# Patient Record
Sex: Male | Born: 1985 | Race: Black or African American | Hispanic: No | Marital: Single | Smoking: Never smoker
Health system: Southern US, Community
[De-identification: ages and names within clinical notes are randomized; demographics above are authoritative.]

---

## 2015-02-24 ENCOUNTER — Emergency Department (HOSPITAL_BASED_OUTPATIENT_CLINIC_OR_DEPARTMENT_OTHER)
Admission: EM | Admit: 2015-02-24 | Discharge: 2015-02-25 | Disposition: A | Discharge status: Home / Self Care | Payer: Self-pay | Attending: Emergency Medicine | Admitting: Emergency Medicine

## 2015-02-24 ENCOUNTER — Encounter (HOSPITAL_BASED_OUTPATIENT_CLINIC_OR_DEPARTMENT_OTHER): Payer: Self-pay | Admitting: *Deleted

## 2015-02-24 ENCOUNTER — Emergency Department (HOSPITAL_BASED_OUTPATIENT_CLINIC_OR_DEPARTMENT_OTHER): Payer: Self-pay

## 2015-02-24 DIAGNOSIS — R1013 Epigastric pain: Secondary | ICD-10-CM

## 2015-02-24 DIAGNOSIS — R112 Nausea with vomiting, unspecified: Secondary | ICD-10-CM | POA: Insufficient documentation

## 2015-02-24 LAB — COMPREHENSIVE METABOLIC PANEL
ALBUMIN: 3.7 g/dL (ref 3.5–5.0)
ALT: 66 U/L — AB (ref 17–63)
AST: 33 U/L (ref 15–41)
Alkaline Phosphatase: 111 U/L (ref 38–126)
Anion gap: 4 — ABNORMAL LOW (ref 5–15)
BUN: 11 mg/dL (ref 6–20)
CHLORIDE: 100 mmol/L — AB (ref 101–111)
CO2: 32 mmol/L (ref 22–32)
Calcium: 8.6 mg/dL — ABNORMAL LOW (ref 8.9–10.3)
Creatinine, Ser: 0.8 mg/dL (ref 0.61–1.24)
GFR calc Af Amer: >60 mL/min (ref >60)
GFR calc non Af Amer: >60 mL/min (ref >60)
GLUCOSE: 110 mg/dL — AB (ref 65–99)
POTASSIUM: 3.8 mmol/L (ref 3.5–5.1)
Sodium: 136 mmol/L (ref 135–145)
Total Bilirubin: 0.2 mg/dL — ABNORMAL LOW (ref 0.3–1.2)
Total Protein: 6.9 g/dL (ref 6.5–8.1)

## 2015-02-24 LAB — CBC WITH DIFFERENTIAL/PLATELET
BASOS ABS: 0 10*3/uL (ref 0.0–0.1)
BASOS PCT: 0 %
EOS PCT: 2 %
Eosinophils Absolute: 0.2 10*3/uL (ref 0.0–0.7)
HCT: 34.3 % — ABNORMAL LOW (ref 39.0–52.0)
Hemoglobin: 11.6 g/dL — ABNORMAL LOW (ref 13.0–17.0)
Lymphocytes Relative: 15 %
Lymphs Abs: 1.6 10*3/uL (ref 0.7–4.0)
MCH: 24 pg — ABNORMAL LOW (ref 26.0–34.0)
MCHC: 33.8 g/dL (ref 30.0–36.0)
MCV: 71 fL — ABNORMAL LOW (ref 78.0–100.0)
MONO ABS: 1 10*3/uL (ref 0.1–1.0)
Monocytes Relative: 10 %
NEUTROS ABS: 7.6 10*3/uL (ref 1.7–7.7)
Neutrophils Relative %: 73 %
PLATELETS: 354 10*3/uL (ref 150–400)
RBC: 4.83 MIL/uL (ref 4.22–5.81)
RDW: 15.8 % — AB (ref 11.5–15.5)
WBC: 10.4 10*3/uL (ref 4.0–10.5)

## 2015-02-24 LAB — LIPASE, BLOOD: Lipase: 21 U/L — ABNORMAL LOW (ref 22–51)

## 2015-02-24 LAB — TROPONIN I: Troponin I: <0.03 ng/mL (ref <0.031)

## 2015-02-24 MED ORDER — HYDROMORPHONE HCL 1 MG/ML IJ SOLN
1.0000 mg | Freq: Once | INTRAMUSCULAR | Status: AC
  Administered 2015-02-24: 1 mg via INTRAVENOUS
  Filled 2015-02-24: qty 1

## 2015-02-24 MED ORDER — SODIUM CHLORIDE 0.9 % IV BOLUS (SEPSIS)
1000.0000 mL | Freq: Once | INTRAVENOUS | Status: AC
  Administered 2015-02-24: 1000 mL via INTRAVENOUS

## 2015-02-24 MED ORDER — ONDANSETRON HCL 4 MG/2ML IJ SOLN
4.0000 mg | Freq: Once | INTRAMUSCULAR | Status: AC
  Administered 2015-02-24: 4 mg via INTRAVENOUS
  Filled 2015-02-24: qty 2

## 2015-02-24 NOTE — ED Notes (Signed)
MD at bedside. 

## 2015-02-24 NOTE — ED Provider Notes (Signed)
CSN: [id number]   Arrival date & time 02/24/15 2221  History  By signing my name below, I, Bethel Born, attest that this documentation has been prepared under the direction and in the presence of Derwood Kaplan, MD. Electronically Signed: Bethel Born, ED Scribe. 02/24/2015. 11:14 PM.  Chief Complaint  Patient presents with  . Abdominal Pain    HPI The history is provided by the patient. No language interpreter was used.   Daxx Tiggs is a 29 y.o. male who presents to the Emergency Department complaining of new and constant periumbilical abdominal pain with sudden onset today around 3 PM. The pain is rated 10/10 in severity and is worse with laying flat. Associated symptoms include nausea and 6 episodes of clear/yellow emesis. He had a normal bowel movement earlier today and has been passing gas from behind. Pt denies diarrhea, dysuria, hematuria. No previous abdominal surgeries. No history of DM or kidney stones. Pt denies tobacco, illicit drugs, and alcohol use.   History reviewed. No pertinent past medical history.  History reviewed. No pertinent past surgical history.  No family history on file.  Social History  Substance Use Topics  . Smoking status: Never Smoker   . Smokeless tobacco: None  . Alcohol Use: No     Review of Systems  Constitutional: Negative for fever and chills.  Cardiovascular: Negative for chest pain.  Gastrointestinal: Positive for nausea, vomiting and abdominal pain.  Neurological: Negative for weakness.  All other systems reviewed and are negative.  Home Medications   Prior to Admission medications   Medication Sig Start Date End Date Taking? Authorizing Provider  HYDROcodone-acetaminophen (NORCO/VICODIN) 5-325 MG tablet Take 1 tablet by mouth every 6 (six) hours as needed. 02/25/15   Derwood Kaplan, MD  ondansetron (ZOFRAN ODT) 8 MG disintegrating tablet Take 1 tablet (8 mg total) by mouth every 8 (eight) hours as needed for nausea.  02/25/15   Derwood Kaplan, MD    Allergies  Review of patient's allergies indicates no known allergies.  Triage Vitals: BP 156/85 mmHg  Pulse 95  Temp(Src) 98.7 F (37.1 C) (Oral)  Resp 24  Ht  (1.778 m)  Wt 130 lb (58.968 kg)  BMI 18.65 kg/m2  SpO2 98%  Physical Exam  Constitutional: He is oriented to person, place, and time. He appears well-developed and well-nourished.  HENT:  Head: Normocephalic and atraumatic.  Eyes: EOM are normal.  Neck: Normal range of motion.  Cardiovascular: Normal rate, regular rhythm, normal heart sounds and intact distal pulses.   Pulmonary/Chest: Effort normal and breath sounds normal. No respiratory distress.  Abdominal: Soft. He exhibits no distension. There is tenderness. There is guarding. There is no rebound.  Upper quadrant tenderness with voluntary guarding  Musculoskeletal: Normal range of motion.  Neurological: He is alert and oriented to person, place, and time.  Skin: Skin is warm and dry.  Psychiatric: He has a normal mood and affect. Judgment normal.  Nursing note and vitals reviewed.   ED Course  Procedures   DIAGNOSTIC STUDIES: Oxygen Saturation is 98% on RA, normal by my interpretation.    COORDINATION OF CARE: 11:12 PM Discussed treatment plan which includes CT A/P, lab work, pain management, and an antiemetic with pt at bedside and pt agreed to plan.  Labs Reviewed  CBC WITH DIFFERENTIAL/PLATELET - Abnormal; Notable for the following:    Hemoglobin 11.6 (*)    HCT 34.3 (*)    MCV 71.0 (*)    MCH 24.0 (*)    RDW  15.8 (*)    All other components within normal limits  COMPREHENSIVE METABOLIC PANEL - Abnormal; Notable for the following:    Chloride 100 (*)    Glucose, Bld 110 (*)    Calcium 8.6 (*)    ALT 66 (*)    Total Bilirubin 0.2 (*)    Anion gap 4 (*)    All other components within normal limits  LIPASE, BLOOD - Abnormal; Notable for the following:    Lipase 21 (*)    All other components within  normal limits  TROPONIN I    Imaging Review Ct Abdomen Pelvis W Contrast  02/25/2015   CLINICAL DATA:  New and constant periumbilical abdominal pain with sudden onset today around 3 p.m. Nausea and vomiting.  EXAM: CT ABDOMEN AND PELVIS WITH CONTRAST  TECHNIQUE: Multidetector CT imaging of the abdomen and pelvis was performed using the standard protocol following bolus administration of intravenous contrast.  CONTRAST:  OMNIPAQUE IOHEXOL 300 MG/ML SOLN, 50mL OMNIPAQUE IOHEXOL 300 MG/ML SOLN  COMPARISON:  None.  FINDINGS: Lung bases are clear. Thickening of the lower esophageal wall may indicate esophagitis or reflux disease.  Surgical absence of the gallbladder. No bile duct dilatation. The liver, spleen, pancreas, adrenal glands, kidneys, abdominal aorta, inferior vena cava, and retroperitoneal lymph nodes are unremarkable. Stomach and small bowel are decompressed. Stool-filled colon without abnormal distention. No free air or free fluid in the abdomen. Abdominal wall musculature appears intact.  Pelvis: Appendix is not identified. Prostate gland is not enlarged. Bladder wall is not thickened. No free or loculated pelvic fluid collections. No pelvic mass or lymphadenopathy. No destructive bone lesions. Mild endplate sclerosis in the lower lumbar spine likely discogenic.  IMPRESSION: No acute process demonstrated in the abdomen or pelvis that would explain patient's symptoms. Appendix is not identified. Bowel are decompressed without evidence of obstruction although inflammatory change in the bowel cannot be excluded. There is thickening of the wall of the distal esophagus which may indicate reflux disease.   Electronically Signed   By: Burman Nieves M.D.   On: 02/25/2015 00:51    :30: Pt's results discussed with him. He has been asked to see pcp or GI doctor for further evaluation. I impressed on him the importance of close f/u.   MDM   Final diagnoses:  Epigastric abdominal pain    I  personally performed the services described in this documentation, which was scribed in my presence. The recorded information has been reviewed and is accurate.  Pt comes in with epigastric abd pain. Pt is s/p cholecystectomy. Pain is sudden. No hx of similar pain. Pt has no hx of drug use, heavy smoking ,alcohol use, nsaid use. His exam reveals diffuse upper quadrant tenderness, worst in the epigastrium. There is no emesis in the room.  DDx includes: Pancreatitis Hepatobiliary pathology including cholecystitis Gastritis/PUD SBO ACS syndrome Aortic Dissection     Derwood Kaplan, MD 02/25/15 8119

## 2015-02-24 NOTE — ED Notes (Signed)
Abdominal pain. Nausea. Vomiting. His lips are cracked.

## 2015-02-25 MED ORDER — FENTANYL CITRATE (PF) 100 MCG/2ML IJ SOLN
50.0000 ug | Freq: Once | INTRAMUSCULAR | Status: AC
  Administered 2015-02-25: 50 ug via INTRAVENOUS
  Filled 2015-02-25: qty 2

## 2015-02-25 MED ORDER — HYDROCODONE-ACETAMINOPHEN 5-325 MG PO TABS: 1.0000 | ORAL_TABLET | Freq: Four times a day (QID) | ORAL | Status: AC | PRN

## 2015-02-25 MED ORDER — PROMETHAZINE HCL 25 MG/ML IJ SOLN
12.5000 mg | Freq: Once | INTRAMUSCULAR | Status: AC
  Administered 2015-02-25: 12.5 mg via INTRAVENOUS
  Filled 2015-02-25: qty 1

## 2015-02-25 MED ORDER — IOHEXOL 300 MG/ML  SOLN
100.0000 mL | Freq: Once | INTRAMUSCULAR | Status: AC | PRN
  Administered 2015-02-25: 100 mL via INTRAVENOUS

## 2015-02-25 MED ORDER — ONDANSETRON 4 MG PO TBDP
4.0000 mg | ORAL_TABLET | Freq: Once | ORAL | Status: AC
  Administered 2015-02-25: 4 mg via ORAL
  Filled 2015-02-25: qty 1

## 2015-02-25 MED ORDER — ONDANSETRON 8 MG PO TBDP: 8.0000 mg | ORAL_TABLET | Freq: Three times a day (TID) | ORAL | Status: AC | PRN

## 2015-02-25 MED ORDER — IOHEXOL 300 MG/ML  SOLN
50.0000 mL | Freq: Once | INTRAMUSCULAR | Status: AC | PRN
  Administered 2015-02-24: 50 mL via ORAL

## 2015-02-25 NOTE — ED Notes (Addendum)
Pt calling out stating that he is dying. Vitals checked and WDL. Pt precedes to hold his breath. Vitals stable. EDP made aware.

## 2015-02-25 NOTE — ED Notes (Signed)
Pt continues to call out and ask to speak with the Dr. After being consoled by 5 staff members. Pt states "I really need to see the doctor, hasn't it been 30 mins I need more medication. EDP and myself at bedside to explain what is to take place.

## 2015-02-25 NOTE — ED Notes (Signed)
Pt ambulatory to the bathroom 

## 2015-02-25 NOTE — ED Notes (Signed)
Patient transported to CT 

## 2015-02-25 NOTE — ED Notes (Signed)
MD at bedside. 

## 2015-02-25 NOTE — ED Notes (Signed)
MD and Charge at bedside to explain discharge.

## 2015-02-25 NOTE — Discharge Instructions (Signed)
We saw you in the ER for the abdominal pain. °All of our results are normal, including all labs and imaging. Kidney function is fine as well. °We are not sure what is causing your abdominal pain, and recommend that you see your primary care doctor within 2-3 days for further evaluation. °If your symptoms get worse, return to the ER. °Take the pain meds and nausea meds as prescribed. ° ° °Abdominal Pain, Adult °Many things can cause abdominal pain. Usually, abdominal pain is not caused by a disease and will improve without treatment. It can often be observed and treated at home. Your health care provider will do a physical exam and possibly order blood tests and X-rays to help determine the seriousness of your pain. However, in many cases, more time must pass before a clear cause of the pain can be found. Before that point, your health care provider may not know if you need more testing or further treatment. °HOME CARE INSTRUCTIONS °Monitor your abdominal pain for any changes. The following actions may help to alleviate any discomfort you are experiencing: °· Only take over-the-counter or prescription medicines as directed by your health care provider. °· Do not take laxatives unless directed to do so by your health care provider. °· Try a clear liquid diet (broth, tea, or water) as directed by your health care provider. Slowly move to a bland diet as tolerated. °SEEK MEDICAL CARE IF: °· You have unexplained abdominal pain. °· You have abdominal pain associated with nausea or diarrhea. °· You have pain when you urinate or have a bowel movement. °· You experience abdominal pain that wakes you in the night. °· You have abdominal pain that is worsened or improved by eating food. °· You have abdominal pain that is worsened with eating fatty foods. °· You have a fever. °SEEK IMMEDIATE MEDICAL CARE IF: °· Your pain does not go away within 2 hours. °· You keep throwing up (vomiting). °· Your pain is felt only in portions of  the abdomen, such as the right side or the left lower portion of the abdomen. °· You pass bloody or black tarry stools. °MAKE SURE YOU: °· Understand these instructions. °· Will watch your condition. °· Will get help right away if you are not doing well or get worse. °  °This information is not intended to replace advice given to you by your health care provider. Make sure you discuss any questions you have with your health care provider. °  °Document Released: 02/10/2005 Document Revised: 01/22/2015 Document Reviewed: 01/10/2013 °Elsevier Interactive Patient Education ©2016 [employer] ° °

## 2015-02-25 NOTE — ED Notes (Signed)
Patient standing at door. Does not verbalize needs. Asked patient to go back in room for his safety.

## 2017-01-24 IMAGING — CT CT ABD-PELV W/ CM
2 of 4 series · 16 of 46 positions shown, 18 images · IV contrast (APPLIED)
Comparison: None.

CLINICAL DATA: New and constant periumbilical abdominal pain with
sudden onset today around 3 p.m.. Nausea and vomiting.

EXAM:
CT ABDOMEN AND PELVIS WITH CONTRAST
TECHNIQUE: Multidetector CT imaging of the abdomen and pelvis was performed
using the standard protocol following bolus administration of
intravenous contrast.
CONTRAST:  100mL OMNIPAQUE IOHEXOL 300 MG/ML SOLN, 50mL OMNIPAQUE
IOHEXOL 300 MG/ML SOLN

[Series 2: abd/pelvis 5.0 b31f · axial · 0.64mm/px · z∈[+676,+1071]mm · 13 of 87 slices shown, 15 images]
[im 4/87  soft-tissue]
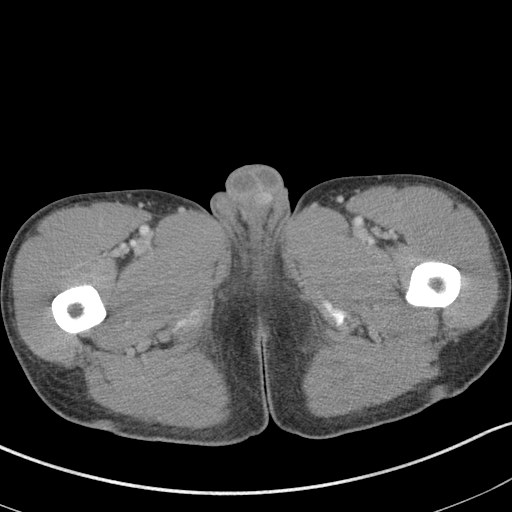
[im 4/87  bone]
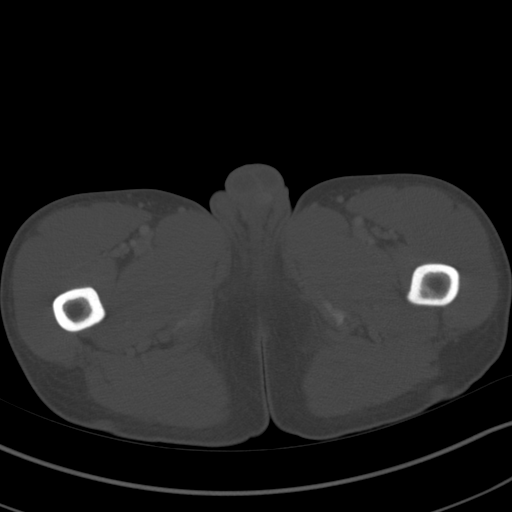
[im 12/87  soft-tissue]
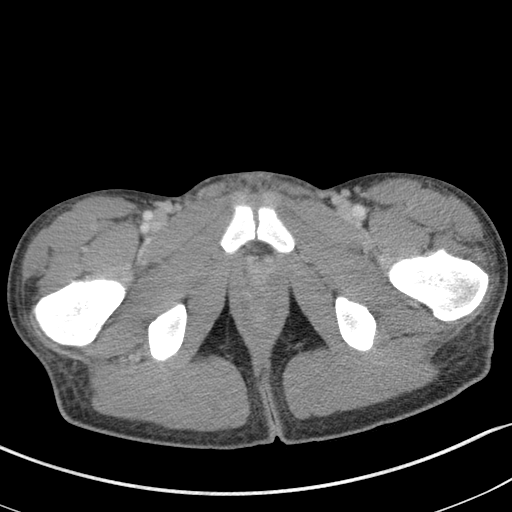
[im 19/87  soft-tissue]
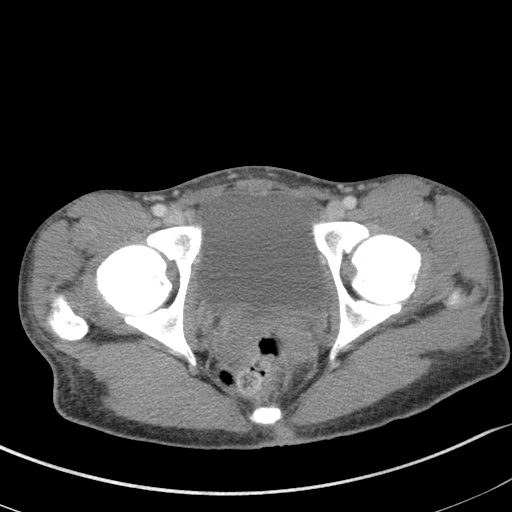
[im 23/87  soft-tissue]
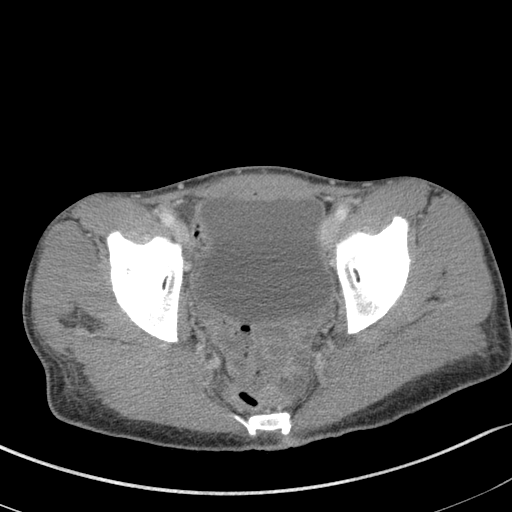
[im 30/87  soft-tissue]
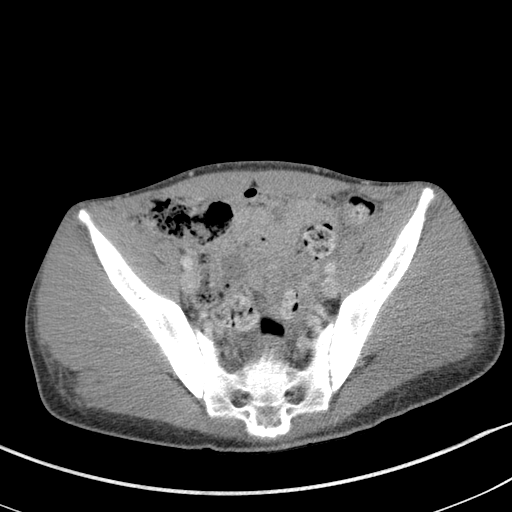
[im 38/87  soft-tissue]
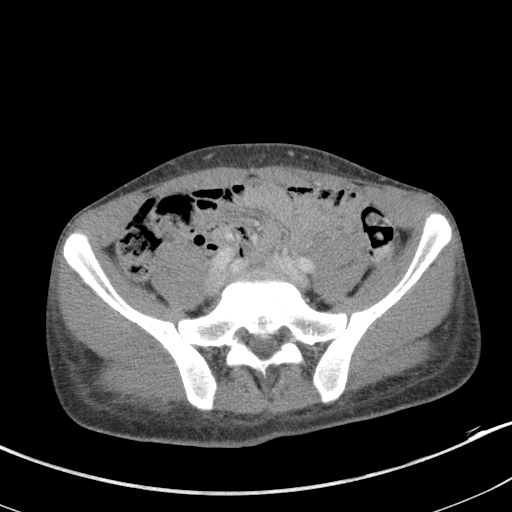
[im 45/87  soft-tissue]
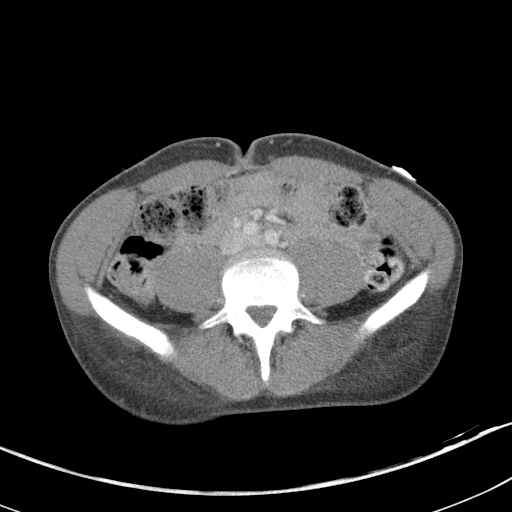
[im 49/87  soft-tissue]
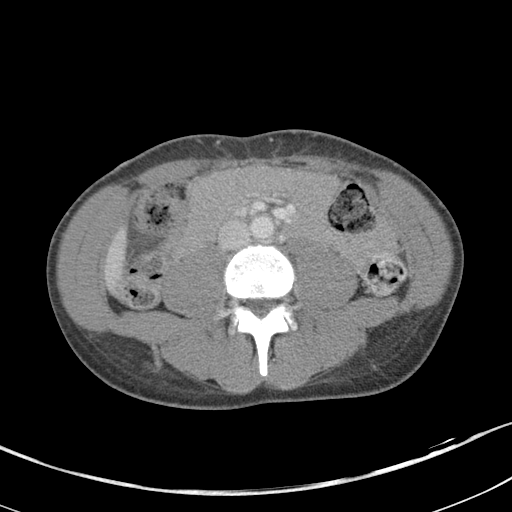
[im 57/87  soft-tissue]
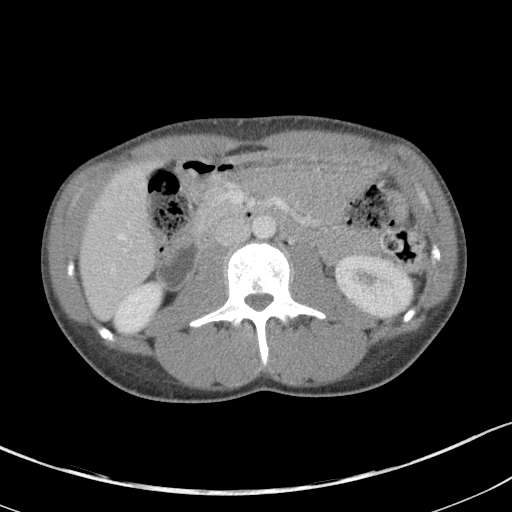
[im 57/87  bone]
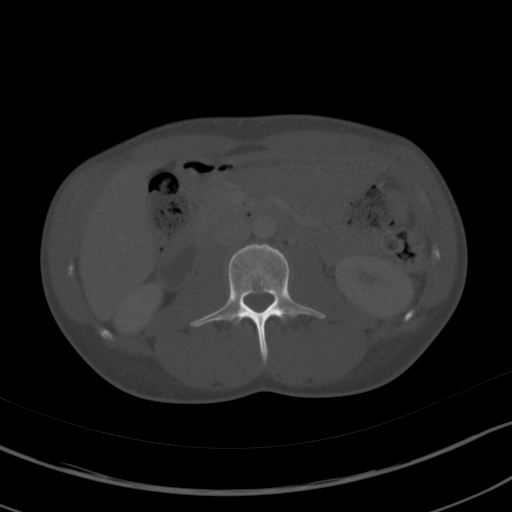
[im 64/87  soft-tissue]
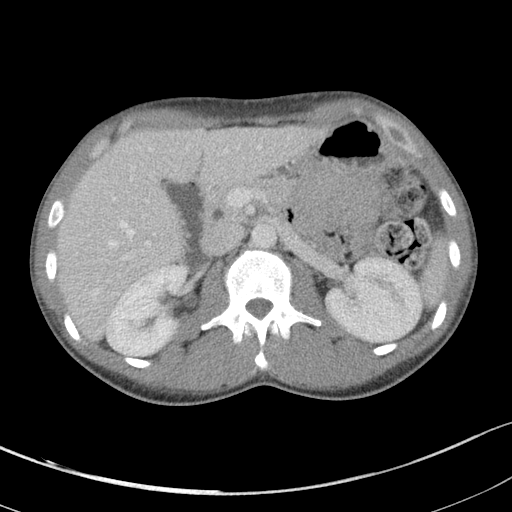
[im 68/87  soft-tissue]
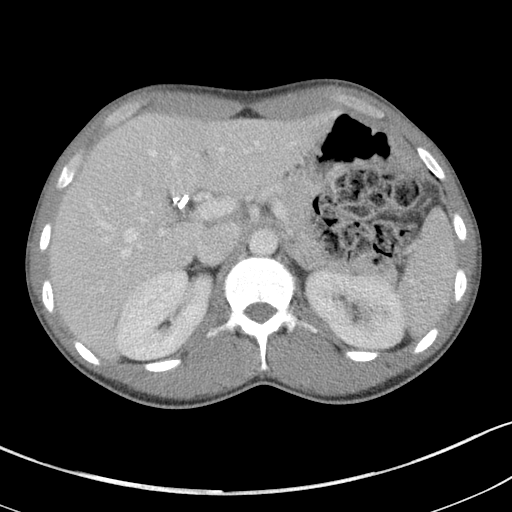
[im 75/87  soft-tissue]
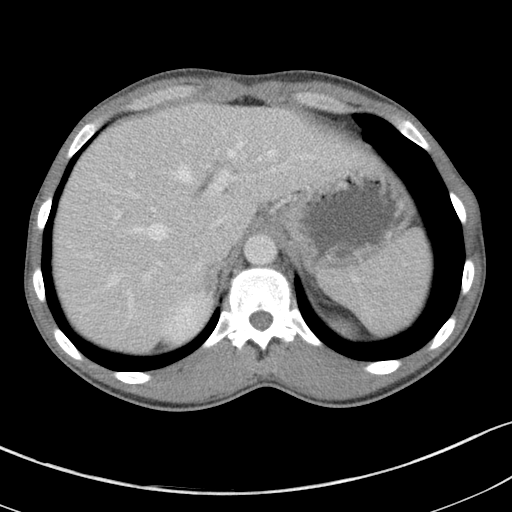
[im 83/87  soft-tissue]
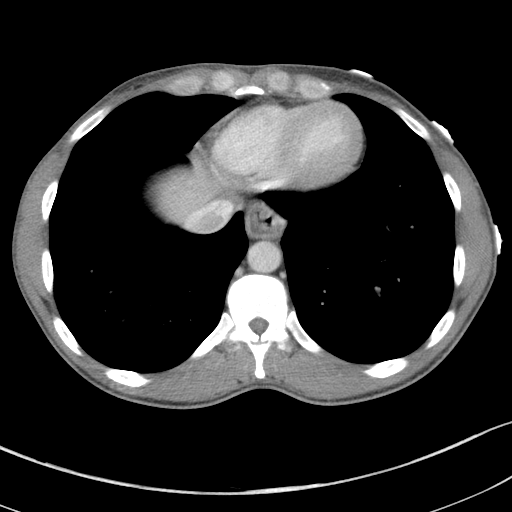

[Series 5: abd/pelvis 3.0 coronal · coronal · 0.65mm/px · 3 of 86 slices shown]
[im 29/86  soft-tissue]
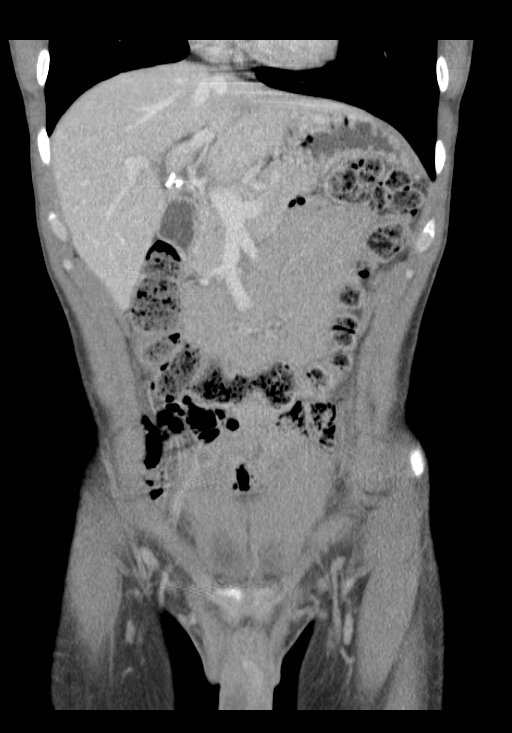
[im 38/86  soft-tissue]
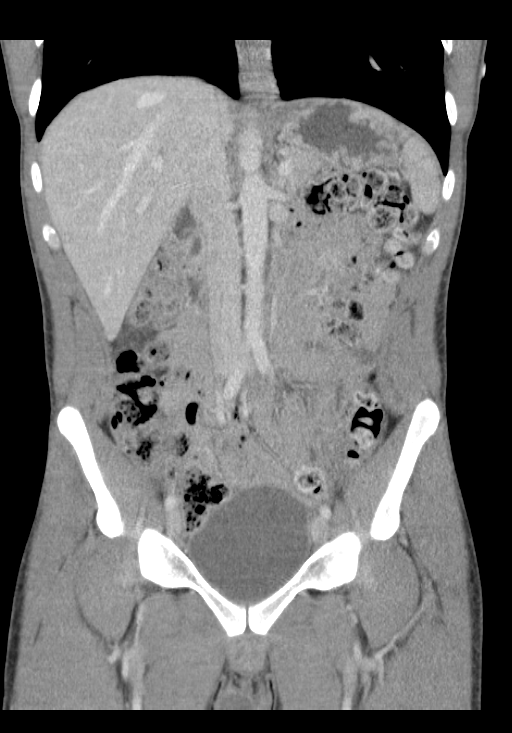
[im 48/86  soft-tissue]
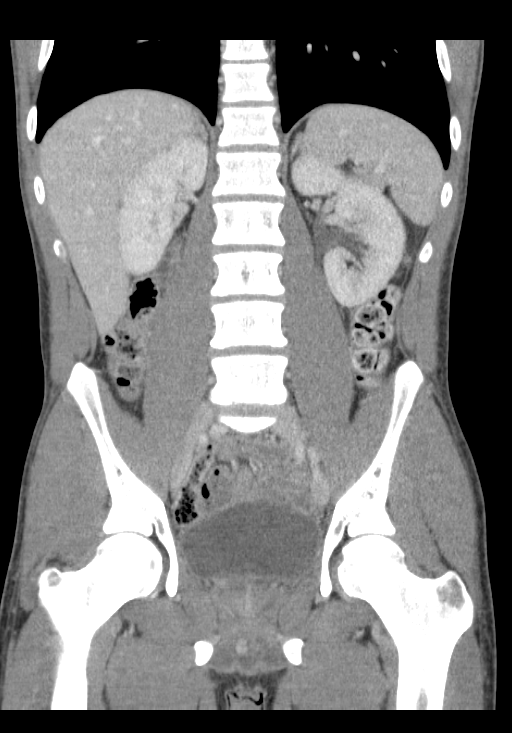

[16 of 46 positions shown; findings below may reference images not displayed]

FINDINGS: Lung bases are clear. Thickening of the lower esophageal wall may
indicate esophagitis or reflux disease.

Surgical absence of the gallbladder. No bile duct dilatation. The
liver, spleen, pancreas, adrenal glands, kidneys, abdominal aorta,
inferior vena cava, and retroperitoneal lymph nodes are
unremarkable. Stomach and small bowel are decompressed. Stool-filled
colon without abnormal distention. No free air or free fluid in the
abdomen. Abdominal wall musculature appears intact.

Pelvis: Appendix is not identified. Prostate gland is not enlarged.
Bladder wall is not thickened. No free or loculated pelvic fluid
collections. No pelvic mass or lymphadenopathy. No destructive bone
lesions. Mild endplate sclerosis in the lower lumbar spine likely
discogenic.
IMPRESSION: No acute process demonstrated in the abdomen or pelvis that would
explain patient's symptoms. Appendix is not identified. Bowel are
decompressed without evidence of obstruction although inflammatory
change in the bowel cannot be excluded. There is thickening of the
wall of the distal esophagus which may indicate reflux disease.
